# Patient Record
Sex: Male | Born: 1990 | Hispanic: No | Marital: Single | Smoking: Never smoker
Health system: Southern US, Community
[De-identification: ages and names within clinical notes are randomized; demographics above are authoritative.]

---
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
## 2012-12-06 ENCOUNTER — Ambulatory Visit (INDEPENDENT_AMBULATORY_CARE_PROVIDER_SITE_OTHER): Payer: Medicaid Other | Admitting: Nurse Practitioner

## 2012-12-06 ENCOUNTER — Encounter: Payer: Self-pay | Admitting: Nurse Practitioner

## 2012-12-06 VITALS — BP 130/70 | Temp 98.6°F | Ht 72.0 in | Wt 221.6 lb

## 2012-12-06 DIAGNOSIS — J029 Acute pharyngitis, unspecified: Secondary | ICD-10-CM

## 2012-12-06 MED ORDER — AZITHROMYCIN 250 MG PO TABS: ORAL_TABLET | ORAL | Status: DC

## 2012-12-06 NOTE — Progress Notes (Signed)
Subjective:  Presents for c/o sore throat for 5 days.  Low grade fever.  Taking fluids well.  Voiding normal. Minimal cough.  No nausea, vomiting, abdominal pain, wheezing or ear pain.  No rash.  Normal energy level.  Objective:   BP 130/70  Temp(Src) 98.6 F (37 C)  Ht 6' (1.829 m)  Wt 221 lb 9.6 oz (100.517 kg)  BMI 30.05 kg/m2 NAD.  Alert, oriented.  TMs minimal clear effusion.  Pharynx mild erythema with one small pocket of white exudate on left tonsil.  Tonsil 2 plus in size. Neck supple with minimal adenopathy.  No post cervical adenopathy.  Lungs clear.  Heart RRR.  Assessment: Acute pharyngitis  Plan:  Meds ordered this encounter  Medications  . azithromycin (ZITHROMAX Z-PAK) 250 MG tablet    Sig: Take 2 tablets (500 mg) on  Day 1,  followed by 1 tablet (250 mg) once daily on Days 2 through 5.    Dispense:  6 each    Refill:  0    Order Specific Question:  Supervising Provider    Answer:  Merlyn Albert [[id number]]   Increase clear fluid intake.  Call back in 4 days if no better, sooner if worse.

## 2013-08-04 ENCOUNTER — Emergency Department (HOSPITAL_COMMUNITY)
Admission: EM | Admit: 2013-08-04 | Discharge: 2013-08-04 | Disposition: A | Discharge status: Home / Self Care | Payer: No Typology Code available for payment source | Attending: Emergency Medicine | Admitting: Emergency Medicine

## 2013-08-04 ENCOUNTER — Emergency Department (HOSPITAL_COMMUNITY): Payer: No Typology Code available for payment source

## 2013-08-04 ENCOUNTER — Encounter (HOSPITAL_COMMUNITY): Payer: Self-pay | Admitting: Emergency Medicine

## 2013-08-04 DIAGNOSIS — Y92410 Unspecified street and highway as the place of occurrence of the external cause: Secondary | ICD-10-CM | POA: Insufficient documentation

## 2013-08-04 DIAGNOSIS — S161XXA Strain of muscle, fascia and tendon at neck level, initial encounter: Secondary | ICD-10-CM

## 2013-08-04 DIAGNOSIS — S139XXA Sprain of joints and ligaments of unspecified parts of neck, initial encounter: Secondary | ICD-10-CM | POA: Insufficient documentation

## 2013-08-04 DIAGNOSIS — Y9389 Activity, other specified: Secondary | ICD-10-CM | POA: Insufficient documentation

## 2013-08-04 MED ORDER — IBUPROFEN 800 MG PO TABS
800.0000 mg | ORAL_TABLET | Freq: Once | ORAL | Status: AC
  Administered 2013-08-04: 800 mg via ORAL
  Filled 2013-08-04: qty 1
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
## 2013-08-05 NOTE — ED Provider Notes (Signed)
Medical screening examination/treatment/procedure(s) were performed by non-physician practitioner and as supervising physician I was immediately available for consultation/collaboration.   EKG Interpretation None       Taariq Leitz L Islay Polanco, MD 08/05/13 1800 

## 2013-09-19 ENCOUNTER — Encounter: Payer: Self-pay | Admitting: Family Medicine

## 2013-09-19 ENCOUNTER — Ambulatory Visit (INDEPENDENT_AMBULATORY_CARE_PROVIDER_SITE_OTHER): Payer: Medicaid Other | Admitting: Family Medicine

## 2013-09-19 VITALS — BP 128/88 | Ht 70.0 in | Wt 212.0 lb

## 2013-09-19 DIAGNOSIS — Z13220 Encounter for screening for lipoid disorders: Secondary | ICD-10-CM

## 2013-09-19 DIAGNOSIS — F988 Other specified behavioral and emotional disorders with onset usually occurring in childhood and adolescence: Secondary | ICD-10-CM | POA: Insufficient documentation

## 2013-09-19 DIAGNOSIS — Z131 Encounter for screening for diabetes mellitus: Secondary | ICD-10-CM

## 2013-09-19 DIAGNOSIS — Z0000 Encounter for general adult medical examination without abnormal findings: Secondary | ICD-10-CM

## 2013-09-19 MED ORDER — METHYLPHENIDATE HCL ER (CD) 30 MG PO CPCR: 30.0000 mg | ORAL_CAPSULE | ORAL | Status: DC
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
## 2013-09-19 NOTE — Patient Instructions (Signed)
Vertigo Vertigo means you feel like you or your surroundings are moving when they are not. Vertigo can be dangerous if it occurs when you are at work, driving, or performing difficult activities.  CAUSES  Vertigo occurs when there is a conflict of signals sent to your brain from the visual and sensory systems in your body. There are many different causes of vertigo, including:  Infections, especially in the inner ear.  A bad reaction to a drug or misuse of alcohol and medicines.  Withdrawal from drugs or alcohol.  Rapidly changing positions, such as lying down or rolling over in bed.  A migraine headache.  Decreased blood flow to the brain.  Increased pressure in the brain from a head injury, infection, tumor, or bleeding. SYMPTOMS  You may feel as though the world is spinning around or you are falling to the ground. Because your balance is upset, vertigo can cause nausea and vomiting. You may have involuntary eye movements (nystagmus). DIAGNOSIS  Vertigo is usually diagnosed by physical exam. If the cause of your vertigo is unknown, your caregiver may perform imaging tests, such as an MRI scan (magnetic resonance imaging). TREATMENT  Most cases of vertigo resolve on their own, without treatment. Depending on the cause, your caregiver may prescribe certain medicines. If your vertigo is related to body position issues, your caregiver may recommend movements or procedures to correct the problem. In rare cases, if your vertigo is caused by certain inner ear problems, you may need surgery. HOME CARE INSTRUCTIONS   Follow your caregiver's instructions.  Avoid driving.  Avoid operating heavy machinery.  Avoid performing any tasks that would be dangerous to you or others during a vertigo episode.  Tell your caregiver if you notice that certain medicines seem to be causing your vertigo. Some of the medicines used to treat vertigo episodes can actually make them worse in some people. SEEK  IMMEDIATE MEDICAL CARE IF:   Your medicines do not relieve your vertigo or are making it worse.  You develop problems with talking, walking, weakness, or using your arms, hands, or legs.  You develop severe headaches.  Your nausea or vomiting continues or gets worse.  You develop visual changes.  A family member notices behavioral changes.  Your condition gets worse. MAKE SURE YOU:  Understand these instructions.  Will watch your condition.  Will get help right away if you are not doing well or get worse. Document Released: 01/12/2005 Document Revised: 06/27/2011 Document Reviewed: 10/21/2010 [employer] Patient Information 2014 [employer].  

## 2013-10-22 ENCOUNTER — Ambulatory Visit: Payer: Medicaid Other | Admitting: Family Medicine
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
## 2015-02-10 ENCOUNTER — Encounter: Payer: Self-pay | Admitting: Family Medicine

## 2015-02-10 ENCOUNTER — Ambulatory Visit (INDEPENDENT_AMBULATORY_CARE_PROVIDER_SITE_OTHER): Payer: BLUE CROSS/BLUE SHIELD | Admitting: Family Medicine

## 2015-02-10 VITALS — Temp 98.3°F | Ht 70.0 in | Wt 208.6 lb

## 2015-02-10 DIAGNOSIS — J019 Acute sinusitis, unspecified: Secondary | ICD-10-CM

## 2015-02-10 DIAGNOSIS — B9689 Other specified bacterial agents as the cause of diseases classified elsewhere: Secondary | ICD-10-CM

## 2015-02-10 MED ORDER — AMOXICILLIN 500 MG PO TABS: 500.0000 mg | ORAL_TABLET | Freq: Three times a day (TID) | ORAL | Status: AC

## 2015-02-10 NOTE — Progress Notes (Signed)
   Subjective:    Patient ID: Bryce Crosby, male    DOB: [birth year], 24 y.o.   MRN: [id number]  Cough This is a new problem. The current episode started in the past 7 days. Associated symptoms include nasal congestion and rhinorrhea. Pertinent negatives include no chest pain, ear pain, fever or wheezing.   PMH benign Patient with a proximally 7 and 10 days of head congestion drainage coughing sinus pressure   Review of Systems  Constitutional: Negative for fever and activity change.  HENT: Positive for congestion and rhinorrhea. Negative for ear pain.   Eyes: Negative for discharge.  Respiratory: Positive for cough. Negative for wheezing.   Cardiovascular: Negative for chest pain.       Objective:   Physical Exam  Constitutional: He appears well-developed.  HENT:  Head: Normocephalic.  Mouth/Throat: Oropharynx is clear and moist. No oropharyngeal exudate.  Neck: Normal range of motion.  Cardiovascular: Normal rate, regular rhythm and normal heart sounds.   No murmur heard. Pulmonary/Chest: Effort normal and breath sounds normal. He has no wheezes.  Lymphadenopathy:    He has no cervical adenopathy.  Neurological: He exhibits normal muscle tone.  Skin: Skin is warm and dry.  Nursing note and vitals reviewed.         Assessment & Plan:  Viral URI secondary rhinosinusitis antibiotics prescribed warning signs discussed follow-up if problems
